# Patient Record
Sex: Female | Born: 1980 | Race: White | Hispanic: No | Marital: Married | Smoking: Never smoker
Health system: Southern US, Community
[De-identification: ages and names within clinical notes are randomized; demographics above are authoritative.]

## PROBLEM LIST (undated history)

## (undated) DIAGNOSIS — M4692 Unspecified inflammatory spondylopathy, cervical region: Secondary | ICD-10-CM

## (undated) DIAGNOSIS — D259 Leiomyoma of uterus, unspecified: Secondary | ICD-10-CM

## (undated) DIAGNOSIS — K589 Irritable bowel syndrome without diarrhea: Secondary | ICD-10-CM

## (undated) DIAGNOSIS — F419 Anxiety disorder, unspecified: Secondary | ICD-10-CM

## (undated) DIAGNOSIS — G4700 Insomnia, unspecified: Secondary | ICD-10-CM

## (undated) DIAGNOSIS — F909 Attention-deficit hyperactivity disorder, unspecified type: Secondary | ICD-10-CM

## (undated) HISTORY — DX: Attention-deficit hyperactivity disorder, unspecified type: F90.9

## (undated) HISTORY — DX: Unspecified inflammatory spondylopathy, cervical region: M46.92

## (undated) HISTORY — DX: Leiomyoma of uterus, unspecified: D25.9

## (undated) HISTORY — DX: Insomnia, unspecified: G47.00

## (undated) HISTORY — DX: Irritable bowel syndrome without diarrhea: K58.9

## (undated) HISTORY — DX: Anxiety disorder, unspecified: F41.9

---

## 2021-05-20 ENCOUNTER — Other Ambulatory Visit: Payer: Self-pay | Admitting: Obstetrics & Gynecology

## 2021-06-17 ENCOUNTER — Other Ambulatory Visit: Payer: Federal, State, Local not specified - PPO | Admitting: Obstetrics & Gynecology

## 2021-12-05 ENCOUNTER — Other Ambulatory Visit (HOSPITAL_COMMUNITY): Payer: Self-pay | Admitting: Nurse Practitioner

## 2021-12-05 DIAGNOSIS — Z1231 Encounter for screening mammogram for malignant neoplasm of breast: Secondary | ICD-10-CM

## 2022-01-07 ENCOUNTER — Ambulatory Visit (HOSPITAL_COMMUNITY): Payer: Federal, State, Local not specified - PPO

## 2022-01-21 ENCOUNTER — Ambulatory Visit (HOSPITAL_COMMUNITY): Payer: Federal, State, Local not specified - PPO

## 2022-01-30 ENCOUNTER — Encounter (HOSPITAL_COMMUNITY): Payer: Self-pay | Admitting: Radiology

## 2022-01-30 ENCOUNTER — Ambulatory Visit (HOSPITAL_COMMUNITY)
Admission: RE | Admit: 2022-01-30 | Discharge: 2022-01-30 | Disposition: A | Discharge status: Home / Self Care | Payer: Federal, State, Local not specified - PPO | Source: Ambulatory Visit | Attending: Nurse Practitioner | Admitting: Nurse Practitioner

## 2022-01-30 DIAGNOSIS — Z1231 Encounter for screening mammogram for malignant neoplasm of breast: Secondary | ICD-10-CM | POA: Diagnosis present

## 2022-03-04 ENCOUNTER — Encounter (INDEPENDENT_AMBULATORY_CARE_PROVIDER_SITE_OTHER): Payer: Self-pay | Admitting: *Deleted

## 2022-05-26 ENCOUNTER — Encounter (INDEPENDENT_AMBULATORY_CARE_PROVIDER_SITE_OTHER): Payer: Self-pay

## 2022-05-26 ENCOUNTER — Other Ambulatory Visit (INDEPENDENT_AMBULATORY_CARE_PROVIDER_SITE_OTHER): Payer: Self-pay

## 2022-05-26 ENCOUNTER — Encounter (INDEPENDENT_AMBULATORY_CARE_PROVIDER_SITE_OTHER): Payer: Self-pay | Admitting: Gastroenterology

## 2022-05-26 ENCOUNTER — Ambulatory Visit (INDEPENDENT_AMBULATORY_CARE_PROVIDER_SITE_OTHER): Payer: Federal, State, Local not specified - PPO | Admitting: Gastroenterology

## 2022-05-26 VITALS — BP 132/88 | HR 85 | Temp 98.8°F | Ht 66.0 in | Wt 144.6 lb

## 2022-05-26 DIAGNOSIS — R1310 Dysphagia, unspecified: Secondary | ICD-10-CM

## 2022-05-26 DIAGNOSIS — Z01812 Encounter for preprocedural laboratory examination: Secondary | ICD-10-CM

## 2022-05-26 NOTE — Patient Instructions (Signed)
We will get you scheduled for upper endoscopy for further evaluation of your symptoms In the meantime, try to avoid thicker, drier foods such as breads and meats as these tend to cause more issues with swallowing, take small bites, chew well and take sips of liquids between bites.  Follow up 3 months

## 2022-05-26 NOTE — Progress Notes (Signed)
Referring Provider: Fayrene Helper, MD Primary Care Physician:  Carlis Abbott, NP Primary GI Physician: new  Chief Complaint  Patient presents with   Dysphagia    Patient here today due to having issues with dysphagia. She says she has issues with the feeling of something stuck in her throat and makes herself vomit to get it up. Once she gets it up it is phlegm. She says this only occurs while eating at dinner.    HPI:   Robin Bowen is a 41 y.o. female with past medical history of anxiety, ADD, IBS   Patient presenting today as a new patient for dysphagia.   Patient reports she is having some intermittent issues with swallowing for the past few months. Feels that foods get stuck just above sternal notch. Sometimes feels that she is congested with sensation in her throat during times of dysphagia. A lot of times symptoms occur at dinner with the first few bites of food and feels that foods get stuck. She will have to go throw the food up as it will not pass down. Food will be covered in phlegm like substance. She will usually feel better after she is able to get the food up and can finish her meal. No issues with liquids or pills. Cannot pinpoint how often this occurs. She denies heartburn or acid reflux. Denies weight loss or changes in appetite. She denies any other globus sensation except when dysphagia is occurring. Does not feel that symptoms are worse with certain foods. Denies sore throat, cough or hoarseness.    NSAID use: none  Social hx: etoh occasionally, no tobacco  Fam hx:no pertinent family hx   Last Colonoscopy:June 2019, [place], normal per patient  Last Endoscopy:never  Recommendations:    Past Medical History:  Diagnosis Date   Anxiety    Attention deficit disorder (ADD), child, with hyperactivity    Insomnia    Irritable bowel syndrome (IBS)    constipation   Leiomyoma of body of uterus    Unspecified inflammatory spondylopathy,  cervical region [facility])     History reviewed. No pertinent surgical history.  Current Outpatient Medications  Medication Sig Dispense Refill   AIMOVIG 140 MG/ML SOAJ Inject into the skin every 30 (thirty) days.     amphetamine-dextroamphetamine (ADDERALL XR) 25 MG 24 hr capsule Take by mouth every morning.     amphetamine-dextroamphetamine (ADDERALL) 10 MG tablet Take 10 mg by mouth daily.     buPROPion (WELLBUTRIN SR) 150 MG 12 hr tablet Take 150 mg by mouth 2 (two) times daily.     citalopram (CELEXA) 40 MG tablet Take 40 mg by mouth daily.     LINZESS 72 MCG capsule Take 72 mcg by mouth daily.     pramipexole (MIRAPEX) 1 MG tablet Take 1 mg by mouth at bedtime.     topiramate (TOPAMAX) 50 MG tablet Take by mouth.     No current facility-administered medications for this visit.    Allergies as of 05/26/2022 - Review Complete 05/26/2022  Allergen Reaction Noted   Ciprofloxacin Hives 05/26/2022   Penicillins  05/26/2022    Family History  Problem Relation Age of Onset   GER disease Mother    Barrett's esophagus Mother    Hiatal hernia Mother     Social History   Socioeconomic History   Marital status: Married    Spouse name: Not on file   Number of children: Not on file   Years of education: Not  on file   Highest education level: Not on file  Occupational History   Not on file  Tobacco Use   Smoking status: Never   Smokeless tobacco: Never  Vaping Use   Vaping Use: Never used  Substance and Sexual Activity   Alcohol use: Yes    Comment: occasionally   Drug use: Not on file   Sexual activity: Not on file  Other Topics Concern   Not on file  Social History Narrative   Not on file   Social Determinants of Health   Financial Resource Strain: Not on file  Food Insecurity: Not on file  Transportation Needs: Not on file  Physical Activity: Not on file  Stress: Not on file  Social Connections: Not on file   Review of systems General: negative for malaise,  night sweats, fever, chills, weight loss Neck: Negative for lumps, goiter, pain and significant neck swelling Resp: Negative for cough, wheezing, dyspnea at rest CV: Negative for chest pain, leg swelling, palpitations, orthopnea GI: denies melena, hematochezia, nausea, vomiting, diarrhea, constipation, odyonophagia, early satiety or unintentional weight loss. +dysphagia  MSK: Negative for joint pain or swelling, back pain, and muscle pain. Derm: Negative for itching or rash Psych: Denies depression, anxiety, memory loss, confusion. No homicidal or suicidal ideation.  Heme: Negative for prolonged bleeding, bruising easily, and swollen nodes. Endocrine: Negative for cold or heat intolerance, polyuria, polydipsia and goiter. Neuro: negative for tremor, gait imbalance, syncope and seizures. The remainder of the review of systems is noncontributory.  Physical Exam: BP 132/88 (BP Location: Left Arm, Patient Position: Sitting, Cuff Size: Small)   Pulse 85   Temp 98.8 F (37.1 C) (Oral)   Ht '5\' 6"'$  (1.676 m)   Wt 144 lb 9.6 oz (65.6 kg)   BMI 23.34 kg/m  General:   Alert and oriented. No distress noted. Pleasant and cooperative.  Head:  Normocephalic and atraumatic. Eyes:  Conjuctiva clear without scleral icterus. Mouth:  Oral mucosa pink and moist. Good dentition. No lesions. Heart: Normal rate and rhythm, s1 and s2 heart sounds present.  Lungs: Clear lung sounds in all lobes. Respirations equal and unlabored. Abdomen:  +BS, soft, non-tender and non-distended. No rebound or guarding. No HSM or masses noted. Derm: No palmar erythema or jaundice Msk:  Symmetrical without gross deformities. Normal posture. Extremities:  Without edema. Neurologic:  Alert and  oriented x4 Psych:  Alert and cooperative. Normal mood and affect.  Invalid input(s): "6 MONTHS"   ASSESSMENT: Robin Bowen is a 41 y.o. female presenting today as a new patient for dysphagia.  Dysphagia for the past few months,  usually in the evenings without any obvious GERD symptoms or symptoms of silent reflux. Denies odynophagia. Unable to pinpoint how often this occurs, food sticks at sternal notch and she will have to regurgitate It back up as it will not pass down. She denies nausea, vomiting, abdominal pain or weight loss. Recommend proceeding with EGD +/- dilation for further evaluation as we cannot rule out esophageal ring, web, stricture, stenosis or malignancy. Advised to avoid thicker, drier foods such as meats and breads, chew thoroughly, take small bites and sips of liquids between bites. Indications, risks and benefits of procedure discussed in detail with patient. Patient verbalized understanding and is in agreement to proceed with EGD =/- dilation at this time.    PLAN:  Chewing precautions  2. Schedule EGD ASA I, ENDO 1  All questions were answered, patient verbalized understanding and is in agreement with  plan as outlined above.    Follow Up: 3 months   Afomia Blackley L. Alver Sorrow, MSN, APRN, AGNP-C Adult-Gerontology Nurse Practitioner [facility]   Patient understood and agreed.

## 2022-07-09 ENCOUNTER — Telehealth (INDEPENDENT_AMBULATORY_CARE_PROVIDER_SITE_OTHER): Payer: Self-pay | Admitting: *Deleted

## 2022-07-09 NOTE — Telephone Encounter (Signed)
Received message from endo. Pt wants to cancel procedure scheduled for tomorrow. Sent carolyn a message letting her know

## 2022-07-10 ENCOUNTER — Ambulatory Visit (HOSPITAL_COMMUNITY)
Admission: RE | Admit: 2022-07-10 | Payer: Federal, State, Local not specified - PPO | Source: Home / Self Care | Admitting: Gastroenterology

## 2022-07-10 ENCOUNTER — Encounter (HOSPITAL_COMMUNITY): Admission: RE | Payer: Self-pay | Source: Home / Self Care

## 2022-07-10 SURGERY — ESOPHAGOGASTRODUODENOSCOPY (EGD) WITH PROPOFOL
Anesthesia: Monitor Anesthesia Care

## 2022-08-07 ENCOUNTER — Encounter (INDEPENDENT_AMBULATORY_CARE_PROVIDER_SITE_OTHER): Payer: Self-pay | Admitting: Gastroenterology

## 2022-09-08 ENCOUNTER — Ambulatory Visit (INDEPENDENT_AMBULATORY_CARE_PROVIDER_SITE_OTHER): Payer: Federal, State, Local not specified - PPO | Admitting: Gastroenterology

## 2022-10-22 ENCOUNTER — Ambulatory Visit (INDEPENDENT_AMBULATORY_CARE_PROVIDER_SITE_OTHER): Payer: Federal, State, Local not specified - PPO | Admitting: Gastroenterology

## 2022-12-15 ENCOUNTER — Encounter (HOSPITAL_COMMUNITY): Payer: Self-pay | Admitting: Nurse Practitioner

## 2022-12-23 ENCOUNTER — Telehealth: Payer: Self-pay

## 2022-12-23 NOTE — Telephone Encounter (Signed)
Called patient left voice mail to call the office in regards to a referral that was received. ep

## 2022-12-28 ENCOUNTER — Telehealth: Payer: Self-pay

## 2022-12-28 NOTE — Telephone Encounter (Signed)
I called patient again this morning about scheduling appointment from the referral that we received from [facility] care. Lvm ep

## 2022-12-29 ENCOUNTER — Other Ambulatory Visit (HOSPITAL_COMMUNITY): Payer: Self-pay | Admitting: Nurse Practitioner

## 2022-12-29 DIAGNOSIS — R928 Other abnormal and inconclusive findings on diagnostic imaging of breast: Secondary | ICD-10-CM

## 2023-01-14 ENCOUNTER — Encounter (HOSPITAL_COMMUNITY): Payer: Federal, State, Local not specified - PPO

## 2023-01-14 ENCOUNTER — Encounter (HOSPITAL_COMMUNITY): Payer: Self-pay

## 2023-01-14 ENCOUNTER — Ambulatory Visit (HOSPITAL_COMMUNITY): Payer: Federal, State, Local not specified - PPO

## 2023-07-19 IMAGING — MG MM DIGITAL SCREENING BILAT W/ TOMO AND CAD
8 series · 9 of 24 positions shown · non-contrast
Comparison: None available.

CLINICAL DATA: Screening.

EXAM:
DIGITAL SCREENING BILATERAL MAMMOGRAM WITH TOMOSYNTHESIS AND CAD
TECHNIQUE: Bilateral screening digital craniocaudal and mediolateral oblique
mammograms were obtained. Bilateral screening digital breast
tomosynthesis was performed. The images were evaluated with
computer-aided detection.

[L CC synth-2D]
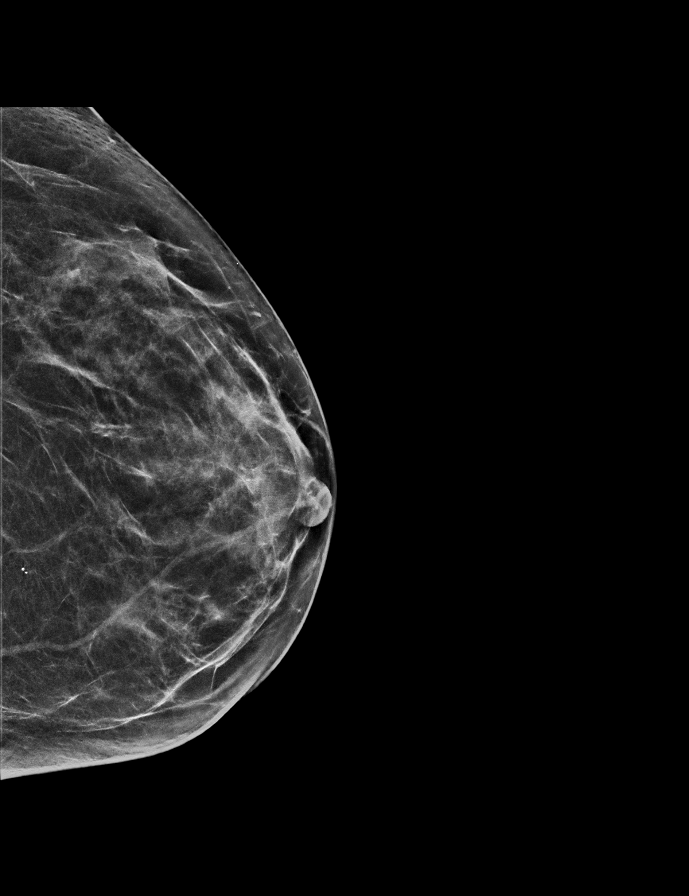

[R MLO synth-2D]
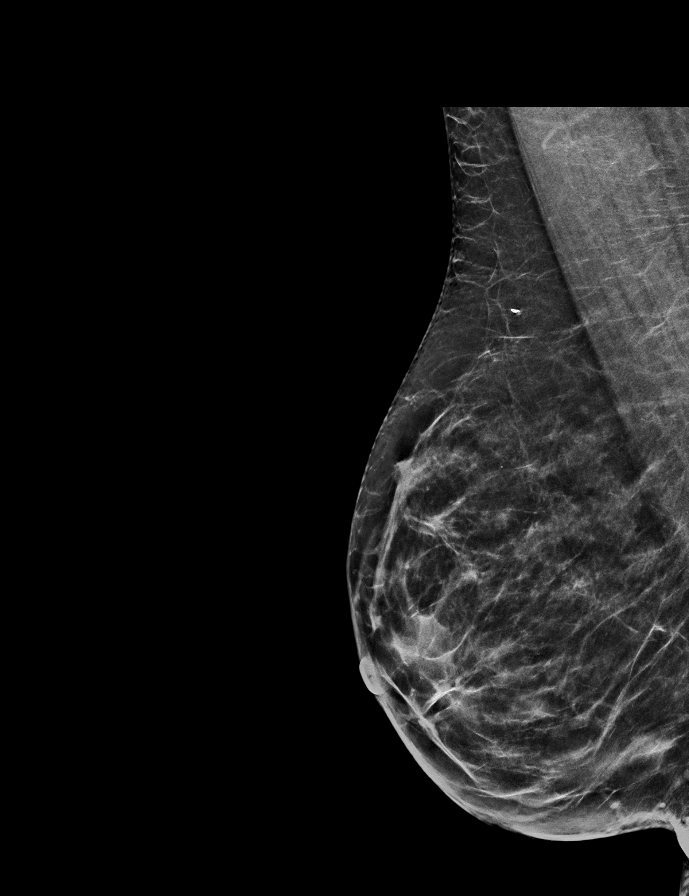

[R CC synth-2D]
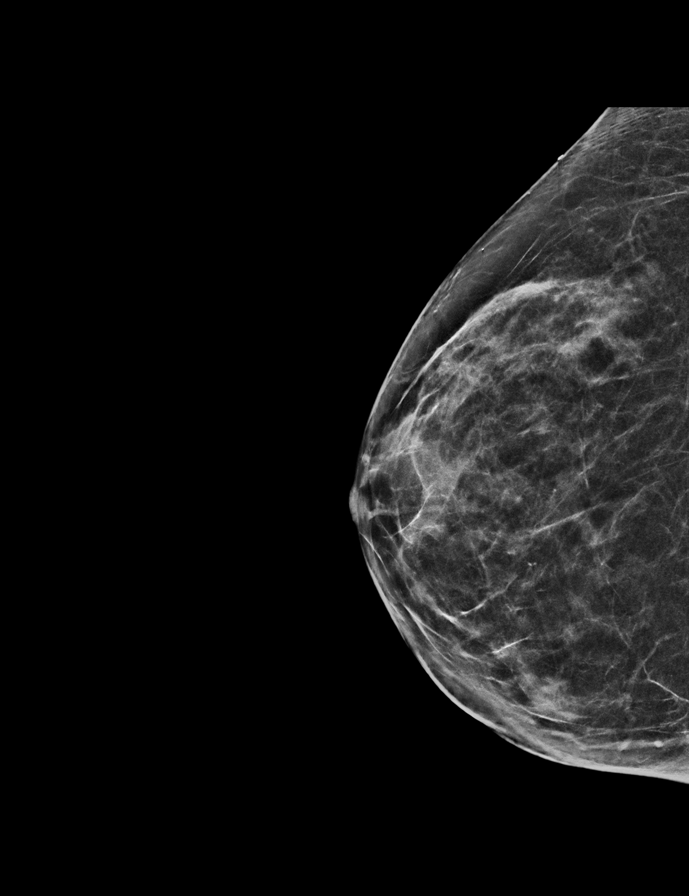

[L MLO synth-2D]
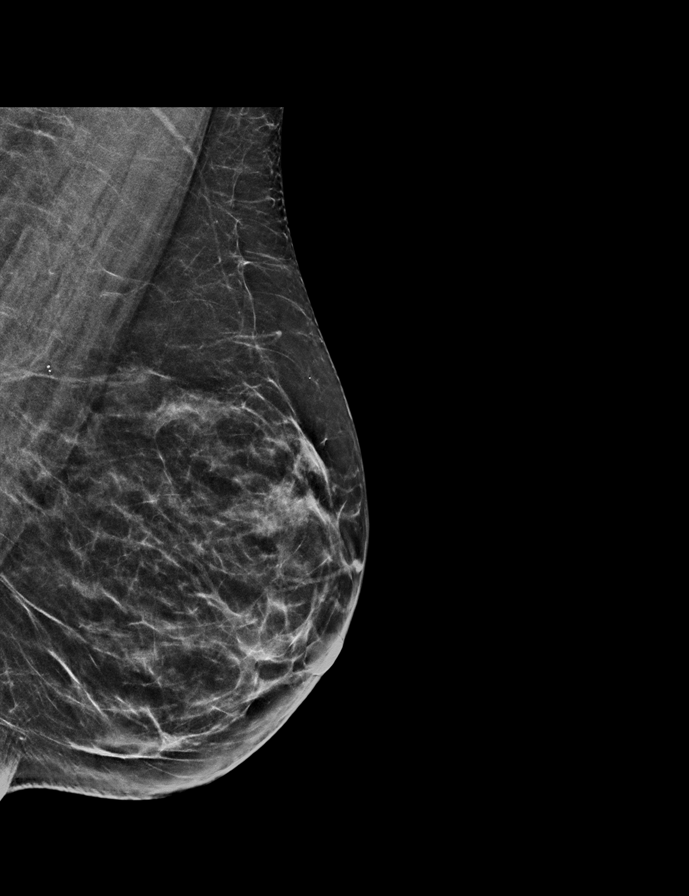

[R MLO tomo · 2 of 55 frames shown]
[frame 18/55]
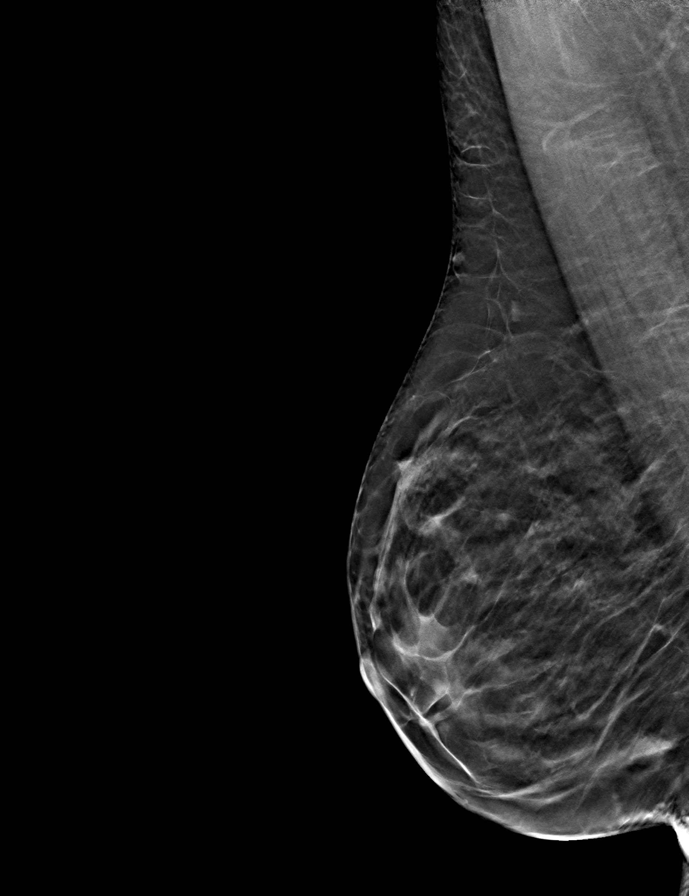
[frame 28/55]
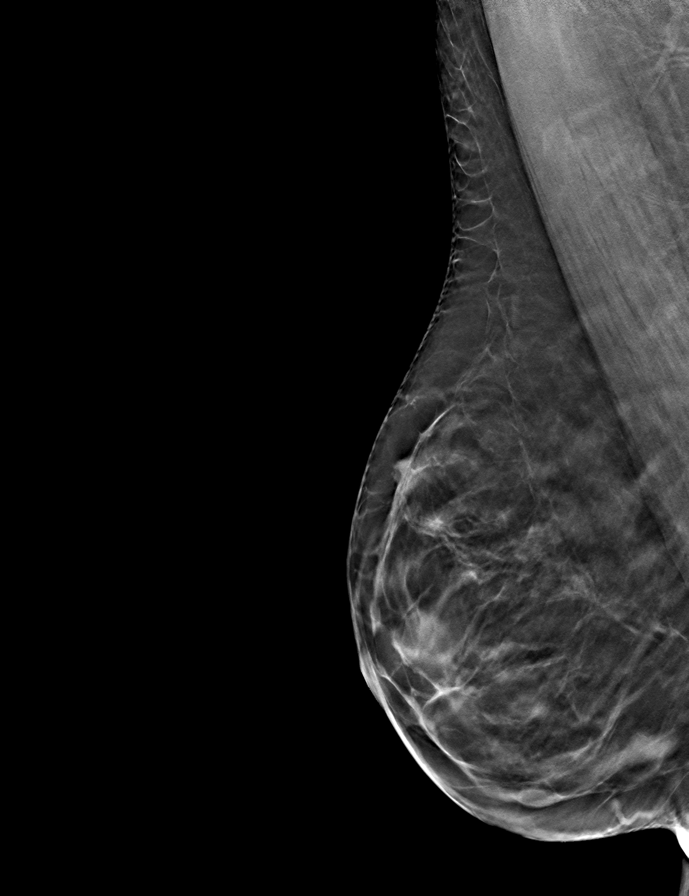

[L MLO tomo · tomo slice 28/55.0]
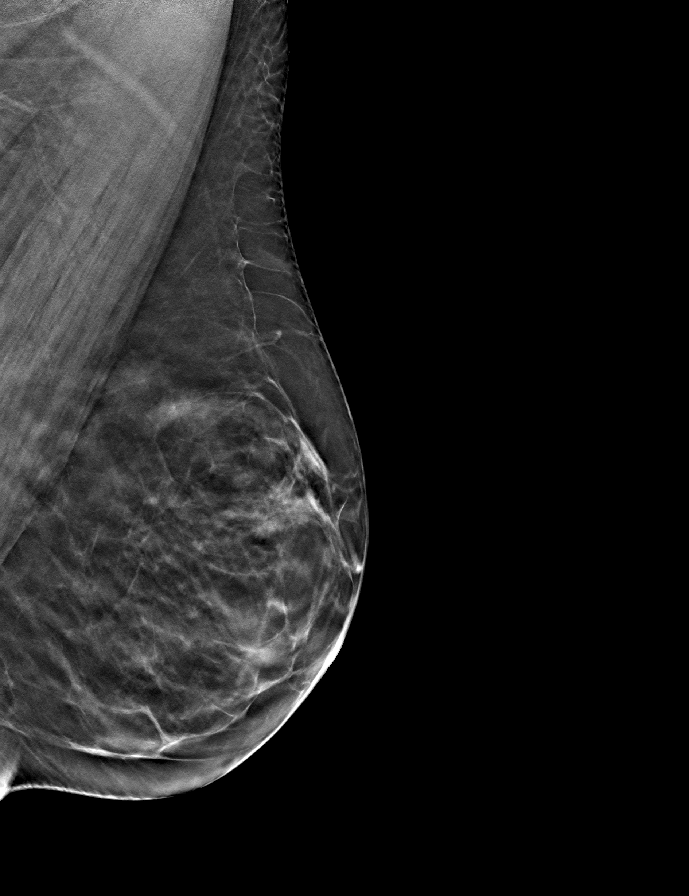

[R CC tomo · tomo slice 27/53.0]
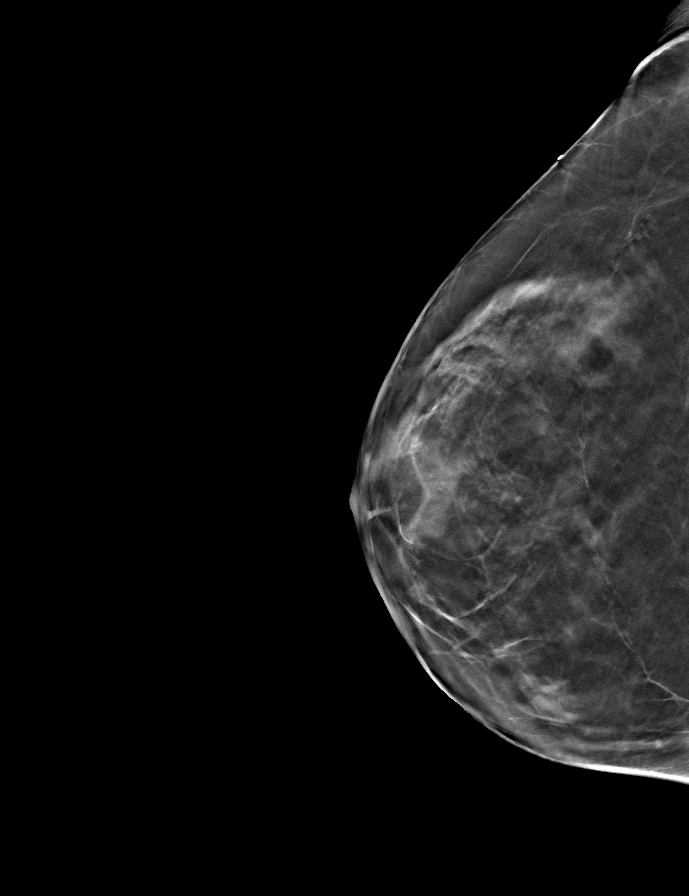

[L CC tomo · tomo slice 27/54.0]
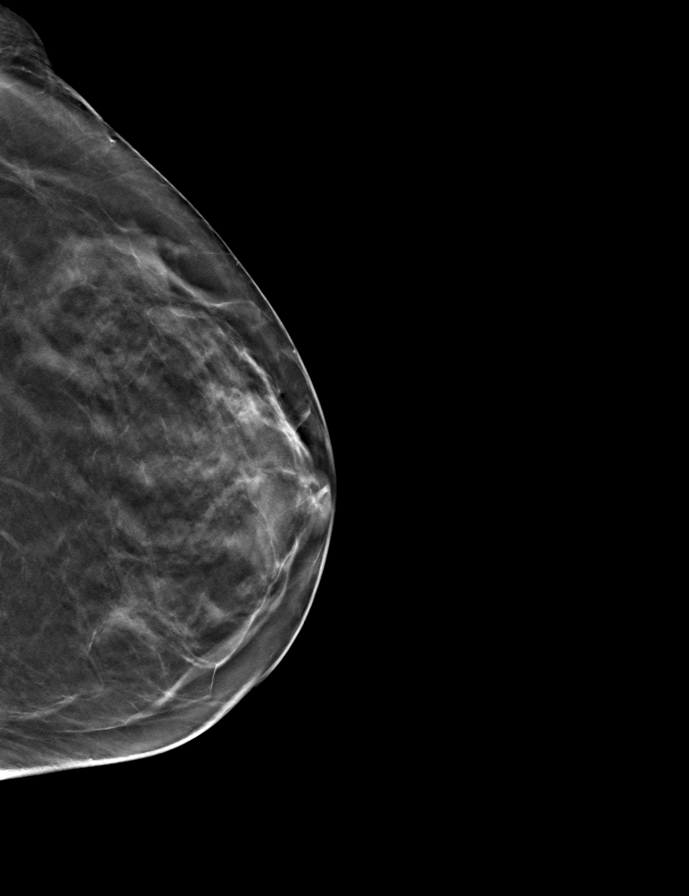

[9 of 24 positions shown; findings below may reference images not displayed]

ACR Breast Density Category c: The breast tissue is heterogeneously
dense, which may obscure small masses
FINDINGS: There are no findings suspicious for malignancy.
IMPRESSION: No mammographic evidence of malignancy. A result letter of this
screening mammogram will be mailed directly to the patient.

RECOMMENDATION:
Screening mammogram in one year. (Code:A7-O-PCM)

BI-RADS CATEGORY  1: Negative.
